# Patient Record
Sex: Male | Born: 1970 | Race: White | Hispanic: No | Smoking: Current every day smoker
Health system: Southern US, Community
[De-identification: ages and names within clinical notes are randomized; demographics above are authoritative.]

## PROBLEM LIST (undated history)

## (undated) DIAGNOSIS — I10 Essential (primary) hypertension: Secondary | ICD-10-CM

## (undated) DIAGNOSIS — N200 Calculus of kidney: Secondary | ICD-10-CM

## (undated) DIAGNOSIS — M1990 Unspecified osteoarthritis, unspecified site: Secondary | ICD-10-CM

## (undated) DIAGNOSIS — F329 Major depressive disorder, single episode, unspecified: Secondary | ICD-10-CM

## (undated) DIAGNOSIS — F319 Bipolar disorder, unspecified: Secondary | ICD-10-CM

## (undated) DIAGNOSIS — F32A Depression, unspecified: Secondary | ICD-10-CM

---
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
## 2002-09-24 HISTORY — PX: APPENDECTOMY: SHX54

## 2004-12-28 ENCOUNTER — Emergency Department (HOSPITAL_COMMUNITY): Admission: EM | Admit: 2004-12-28 | Discharge: 2004-12-28 | Payer: Self-pay | Admitting: Emergency Medicine

## 2005-04-25 ENCOUNTER — Emergency Department (HOSPITAL_COMMUNITY): Admission: EM | Admit: 2005-04-25 | Discharge: 2005-04-25 | Payer: Self-pay | Admitting: Emergency Medicine

## 2006-04-23 ENCOUNTER — Emergency Department (HOSPITAL_COMMUNITY): Admission: EM | Admit: 2006-04-23 | Discharge: 2006-04-24 | Payer: Self-pay | Admitting: Emergency Medicine

## 2007-01-04 ENCOUNTER — Inpatient Hospital Stay (HOSPITAL_COMMUNITY): Admission: EM | Admit: 2007-01-04 | Discharge: 2007-01-07 | Payer: Self-pay | Admitting: Emergency Medicine

## 2007-01-10 ENCOUNTER — Emergency Department (HOSPITAL_COMMUNITY): Admission: EM | Admit: 2007-01-10 | Discharge: 2007-01-10 | Payer: Self-pay | Admitting: Emergency Medicine

## 2007-07-25 ENCOUNTER — Emergency Department (HOSPITAL_COMMUNITY): Admission: EM | Admit: 2007-07-25 | Discharge: 2007-07-25 | Payer: Self-pay | Admitting: Emergency Medicine

## 2007-11-08 ENCOUNTER — Other Ambulatory Visit: Payer: Self-pay | Admitting: Emergency Medicine

## 2007-11-08 ENCOUNTER — Ambulatory Visit: Payer: Self-pay | Admitting: *Deleted

## 2007-11-08 ENCOUNTER — Inpatient Hospital Stay (HOSPITAL_COMMUNITY): Admission: AD | Admit: 2007-11-08 | Discharge: 2007-11-17 | Payer: Self-pay | Admitting: *Deleted

## 2009-02-24 ENCOUNTER — Emergency Department (HOSPITAL_COMMUNITY): Admission: EM | Admit: 2009-02-24 | Discharge: 2009-02-25 | Payer: Self-pay | Admitting: Emergency Medicine

## 2009-02-25 ENCOUNTER — Inpatient Hospital Stay (HOSPITAL_COMMUNITY): Admission: AD | Admit: 2009-02-25 | Discharge: 2009-03-02 | Payer: Self-pay | Admitting: *Deleted

## 2009-02-25 ENCOUNTER — Emergency Department (HOSPITAL_COMMUNITY): Admission: EM | Admit: 2009-02-25 | Discharge: 2009-02-25 | Payer: Self-pay | Admitting: *Deleted

## 2009-02-25 ENCOUNTER — Ambulatory Visit: Payer: Self-pay | Admitting: *Deleted

## 2009-09-24 ENCOUNTER — Emergency Department (HOSPITAL_COMMUNITY): Admission: EM | Admit: 2009-09-24 | Discharge: 2009-09-24 | Payer: Self-pay | Admitting: Emergency Medicine

## 2009-09-25 ENCOUNTER — Ambulatory Visit: Payer: Self-pay | Admitting: Psychiatry

## 2009-09-25 ENCOUNTER — Inpatient Hospital Stay (HOSPITAL_COMMUNITY): Admission: RE | Admit: 2009-09-25 | Discharge: 2009-09-29 | Payer: Self-pay | Admitting: Psychiatry

## 2009-10-29 ENCOUNTER — Emergency Department (HOSPITAL_COMMUNITY): Admission: EM | Admit: 2009-10-29 | Discharge: 2009-10-30 | Payer: Self-pay | Admitting: Emergency Medicine

## 2009-10-30 ENCOUNTER — Ambulatory Visit: Payer: Self-pay | Admitting: Psychiatry

## 2009-10-30 ENCOUNTER — Inpatient Hospital Stay (HOSPITAL_COMMUNITY): Admission: RE | Admit: 2009-10-30 | Discharge: 2009-11-03 | Payer: Self-pay | Admitting: Psychiatry

## 2009-11-03 ENCOUNTER — Emergency Department (HOSPITAL_BASED_OUTPATIENT_CLINIC_OR_DEPARTMENT_OTHER): Admission: EM | Admit: 2009-11-03 | Discharge: 2009-11-04 | Payer: Self-pay | Admitting: Emergency Medicine

## 2009-11-04 ENCOUNTER — Inpatient Hospital Stay (HOSPITAL_COMMUNITY): Admission: AD | Admit: 2009-11-04 | Discharge: 2009-11-11 | Payer: Self-pay | Admitting: Psychiatry

## 2010-08-16 ENCOUNTER — Ambulatory Visit: Payer: Self-pay | Admitting: Psychiatry

## 2010-08-16 ENCOUNTER — Emergency Department (HOSPITAL_COMMUNITY): Admission: EM | Admit: 2010-08-16 | Discharge: 2010-08-16 | Payer: Self-pay | Admitting: Emergency Medicine

## 2010-08-17 ENCOUNTER — Inpatient Hospital Stay (HOSPITAL_COMMUNITY)
Admission: EM | Admit: 2010-08-17 | Discharge: 2010-08-22 | Payer: Self-pay | Source: Home / Self Care | Admitting: Psychiatry

## 2010-08-18 ENCOUNTER — Emergency Department (HOSPITAL_COMMUNITY): Admission: EM | Admit: 2010-08-18 | Discharge: 2010-08-18 | Payer: Self-pay | Admitting: Emergency Medicine

## 2010-08-22 ENCOUNTER — Emergency Department (HOSPITAL_COMMUNITY)
Admission: EM | Admit: 2010-08-22 | Discharge: 2010-08-27 | Disposition: A | Discharge status: Other Acute Inpatient Hospital | Payer: Self-pay | Source: Home / Self Care | Admitting: Emergency Medicine

## 2010-08-23 DIAGNOSIS — IMO0002 Reserved for concepts with insufficient information to code with codable children: Secondary | ICD-10-CM

## 2010-12-05 LAB — DIFFERENTIAL
Basophils Absolute: 0 10*3/uL (ref 0.0–0.1)
Basophils Absolute: 0 10*3/uL (ref 0.0–0.1)
Basophils Relative: 1 % (ref 0–1)
Eosinophils Absolute: 0.1 10*3/uL (ref 0.0–0.7)
Eosinophils Relative: 3 % (ref 0–5)
Lymphocytes Relative: 33 % (ref 12–46)
Lymphocytes Relative: 25 % (ref 12–46)
Lymphocytes Relative: 27 % (ref 12–46)
Lymphs Abs: 1.9 10*3/uL (ref 0.7–4.0)
Monocytes Absolute: 0.5 10*3/uL (ref 0.1–1.0)
Monocytes Absolute: 0.4 10*3/uL (ref 0.1–1.0)
Monocytes Relative: 8 % (ref 3–12)
Neutro Abs: 3.3 10*3/uL (ref 1.7–7.7)
Neutro Abs: 4.9 10*3/uL (ref 1.7–7.7)
Neutrophils Relative %: 65 % (ref 43–77)

## 2010-12-05 LAB — CBC
HCT: 43.4 % (ref 39.0–52.0)
Hemoglobin: 15.2 g/dL (ref 13.0–17.0)
Hemoglobin: 14.9 g/dL (ref 13.0–17.0)
MCHC: 35.3 g/dL (ref 30.0–36.0)
MCV: 89.5 fL (ref 78.0–100.0)
Platelets: 275 10*3/uL (ref 150–400)
RBC: 4.73 MIL/uL (ref 4.22–5.81)
RBC: 4.59 MIL/uL (ref 4.22–5.81)
RDW: 13.1 % (ref 11.5–15.5)
RDW: 13 % (ref 11.5–15.5)
WBC: 6.1 10*3/uL (ref 4.0–10.5)
WBC: 5.6 10*3/uL (ref 4.0–10.5)
WBC: 7.6 10*3/uL (ref 4.0–10.5)

## 2010-12-05 LAB — RAPID URINE DRUG SCREEN, HOSP PERFORMED
Amphetamines: NOT DETECTED
Barbiturates: NOT DETECTED
Benzodiazepines: POSITIVE — AB
Benzodiazepines: POSITIVE — AB
Cocaine: NOT DETECTED
Cocaine: NOT DETECTED
Opiates: POSITIVE — AB
Tetrahydrocannabinol: NOT DETECTED

## 2010-12-05 LAB — URINE CULTURE
Colony Count: 80000
Culture  Setup Time: 201111300120

## 2010-12-05 LAB — URINALYSIS, ROUTINE W REFLEX MICROSCOPIC
Bilirubin Urine: NEGATIVE
Bilirubin Urine: NEGATIVE
Glucose, UA: NEGATIVE mg/dL
Glucose, UA: NEGATIVE mg/dL
Ketones, ur: NEGATIVE mg/dL
Nitrite: POSITIVE — AB
Specific Gravity, Urine: 1.027 (ref 1.005–1.030)
Urobilinogen, UA: 0.2 mg/dL (ref 0.0–1.0)
pH: 6 (ref 5.0–8.0)
pH: 7.5 (ref 5.0–8.0)

## 2010-12-05 LAB — BASIC METABOLIC PANEL
BUN: 9 mg/dL (ref 6–23)
BUN: 11 mg/dL (ref 6–23)
Chloride: 100 mEq/L (ref 96–112)
Creatinine, Ser: 0.86 mg/dL (ref 0.4–1.5)
GFR calc Af Amer: >60 mL/min (ref >60)
GFR calc non Af Amer: >60 mL/min (ref >60)
Glucose, Bld: 104 mg/dL — ABNORMAL HIGH (ref 70–99)
Potassium: 3.8 mEq/L (ref 3.5–5.1)
Sodium: 136 mEq/L (ref 135–145)

## 2010-12-05 LAB — POCT I-STAT, CHEM 8
Chloride: 101 mEq/L (ref 96–112)
Creatinine, Ser: 0.8 mg/dL (ref 0.4–1.5)
Glucose, Bld: 98 mg/dL (ref 70–99)
HCT: 45 % (ref 39.0–52.0)
Potassium: 4.1 mEq/L (ref 3.5–5.1)

## 2010-12-05 LAB — POCT CARDIAC MARKERS
CKMB, poc: <1 ng/mL — ABNORMAL LOW (ref 1.0–8.0)
Myoglobin, poc: 37.7 ng/mL (ref 12–200)
Troponin i, poc: <0.05 ng/mL (ref 0.00–0.09)

## 2010-12-05 LAB — URINE MICROSCOPIC-ADD ON

## 2010-12-05 LAB — TRICYCLICS SCREEN, URINE: TCA Scrn: NOT DETECTED

## 2010-12-05 LAB — ETHANOL: Alcohol, Ethyl (B): <5 mg/dL (ref 0–10)

## 2010-12-10 LAB — COMPREHENSIVE METABOLIC PANEL
ALT: 43 U/L (ref 0–53)
AST: 69 U/L — ABNORMAL HIGH (ref 0–37)
Alkaline Phosphatase: 63 U/L (ref 39–117)
CO2: 27 mEq/L (ref 19–32)
CO2: 30 mEq/L (ref 19–32)
Calcium: 8.8 mg/dL (ref 8.4–10.5)
Chloride: 102 mEq/L (ref 96–112)
Creatinine, Ser: 0.91 mg/dL (ref 0.4–1.5)
GFR calc Af Amer: >60 mL/min (ref >60)
GFR calc non Af Amer: >60 mL/min (ref >60)
GFR calc non Af Amer: >60 mL/min (ref >60)
Glucose, Bld: 101 mg/dL — ABNORMAL HIGH (ref 70–99)
Glucose, Bld: 113 mg/dL — ABNORMAL HIGH (ref 70–99)
Potassium: 4.3 mEq/L (ref 3.5–5.1)
Sodium: 133 mEq/L — ABNORMAL LOW (ref 135–145)
Sodium: 135 mEq/L (ref 135–145)
Total Bilirubin: 0.5 mg/dL (ref 0.3–1.2)
Total Protein: 6.3 g/dL (ref 6.0–8.3)
Total Protein: 6.6 g/dL (ref 6.0–8.3)

## 2010-12-10 LAB — URINALYSIS, ROUTINE W REFLEX MICROSCOPIC
Glucose, UA: NEGATIVE mg/dL
Ketones, ur: NEGATIVE mg/dL
Nitrite: NEGATIVE
Specific Gravity, Urine: 1.02 (ref 1.005–1.030)
pH: 5.5 (ref 5.0–8.0)

## 2010-12-10 LAB — URINE MICROSCOPIC-ADD ON

## 2010-12-10 LAB — LIPASE, BLOOD: Lipase: 21 U/L (ref 11–59)

## 2010-12-10 LAB — LACTIC ACID, PLASMA: Lactic Acid, Venous: 2.4 mmol/L — ABNORMAL HIGH (ref 0.5–2.2)

## 2010-12-10 LAB — GC/CHLAMYDIA PROBE AMP, GENITAL
Chlamydia, DNA Probe: NEGATIVE
GC Probe Amp, Genital: NEGATIVE

## 2010-12-10 LAB — DIFFERENTIAL
Lymphocytes Relative: 23 % (ref 12–46)
Lymphs Abs: 1.8 10*3/uL (ref 0.7–4.0)
Monocytes Relative: 8 % (ref 3–12)
Neutrophils Relative %: 65 % (ref 43–77)

## 2010-12-10 LAB — LIPID PANEL
Cholesterol: 173 mg/dL (ref 0–200)
Total CHOL/HDL Ratio: 5.6 RATIO

## 2010-12-10 LAB — CBC
MCHC: 34.7 g/dL (ref 30.0–36.0)
MCV: 89 fL (ref 78.0–100.0)
RBC: 4.07 MIL/uL — ABNORMAL LOW (ref 4.22–5.81)
RDW: 14.1 % (ref 11.5–15.5)

## 2010-12-10 LAB — POTASSIUM: Potassium: 3.8 mEq/L (ref 3.5–5.1)

## 2010-12-10 LAB — URINE CULTURE

## 2010-12-10 LAB — RAPID URINE DRUG SCREEN, HOSP PERFORMED: Tetrahydrocannabinol: NOT DETECTED

## 2010-12-13 LAB — RAPID URINE DRUG SCREEN, HOSP PERFORMED
Barbiturates: NOT DETECTED
Benzodiazepines: POSITIVE — AB
Opiates: NOT DETECTED

## 2010-12-13 LAB — CBC
HCT: 37.7 % — ABNORMAL LOW (ref 39.0–52.0)
Hemoglobin: 12.9 g/dL — ABNORMAL LOW (ref 13.0–17.0)
MCHC: 34.2 g/dL (ref 30.0–36.0)
MCV: 88.4 fL (ref 78.0–100.0)
Platelets: 266 10*3/uL (ref 150–400)
RBC: 4.27 MIL/uL (ref 4.22–5.81)
RDW: 14.4 % (ref 11.5–15.5)
WBC: 9.7 10*3/uL (ref 4.0–10.5)

## 2010-12-13 LAB — URINALYSIS, ROUTINE W REFLEX MICROSCOPIC
Glucose, UA: NEGATIVE mg/dL
Protein, ur: NEGATIVE mg/dL
Specific Gravity, Urine: 1.02 (ref 1.005–1.030)

## 2010-12-13 LAB — BASIC METABOLIC PANEL
CO2: 25 mEq/L (ref 19–32)
Calcium: 9.2 mg/dL (ref 8.4–10.5)
Creatinine, Ser: 0.87 mg/dL (ref 0.4–1.5)
GFR calc non Af Amer: >60 mL/min (ref >60)
Glucose, Bld: 83 mg/dL (ref 70–99)
Sodium: 136 mEq/L (ref 135–145)

## 2010-12-13 LAB — DIFFERENTIAL
Basophils Absolute: 0 10*3/uL (ref 0.0–0.1)
Basophils Relative: 0 % (ref 0–1)
Lymphocytes Relative: 12 % (ref 12–46)
Neutro Abs: 7.7 10*3/uL (ref 1.7–7.7)
Neutrophils Relative %: 79 % — ABNORMAL HIGH (ref 43–77)

## 2010-12-13 LAB — SALICYLATE LEVEL: Salicylate Lvl: <4 mg/dL (ref 2.8–20.0)

## 2010-12-13 LAB — ACETAMINOPHEN LEVEL: Acetaminophen (Tylenol), Serum: <10 ug/mL — ABNORMAL LOW (ref 10–30)

## 2010-12-14 LAB — DIFFERENTIAL
Eosinophils Absolute: 0.2 10*3/uL (ref 0.0–0.7)
Lymphs Abs: 1.6 10*3/uL (ref 0.7–4.0)
Monocytes Absolute: 0.5 10*3/uL (ref 0.1–1.0)
Monocytes Relative: 8 % (ref 3–12)
Neutro Abs: 3.7 10*3/uL (ref 1.7–7.7)
Neutrophils Relative %: 62 % (ref 43–77)

## 2010-12-14 LAB — BASIC METABOLIC PANEL
Chloride: 101 mEq/L (ref 96–112)
Creatinine, Ser: 0.8 mg/dL (ref 0.4–1.5)
GFR calc Af Amer: >60 mL/min (ref >60)
Sodium: 139 mEq/L (ref 135–145)

## 2010-12-14 LAB — POCT TOXICOLOGY PANEL

## 2010-12-14 LAB — CBC
Hemoglobin: 13 g/dL (ref 13.0–17.0)
MCV: 87.3 fL (ref 78.0–100.0)
RBC: 4.29 MIL/uL (ref 4.22–5.81)
WBC: 6 10*3/uL (ref 4.0–10.5)

## 2010-12-14 LAB — ETHANOL: Alcohol, Ethyl (B): <10 mg/dL (ref 0–10)

## 2011-01-01 LAB — DIFFERENTIAL
Eosinophils Absolute: 0.5 10*3/uL (ref 0.0–0.7)
Eosinophils Relative: 7 % — ABNORMAL HIGH (ref 0–5)
Lymphs Abs: 1.2 10*3/uL (ref 0.7–4.0)
Monocytes Relative: 8 % (ref 3–12)

## 2011-01-01 LAB — CBC
HCT: 47.7 % (ref 39.0–52.0)
MCHC: 34.2 g/dL (ref 30.0–36.0)
MCV: 92.7 fL (ref 78.0–100.0)
RBC: 5.14 MIL/uL (ref 4.22–5.81)
WBC: 7.3 10*3/uL (ref 4.0–10.5)

## 2011-01-01 LAB — RAPID URINE DRUG SCREEN, HOSP PERFORMED
Amphetamines: NOT DETECTED
Benzodiazepines: POSITIVE — AB
Cocaine: NOT DETECTED
Tetrahydrocannabinol: NOT DETECTED

## 2011-01-01 LAB — POCT I-STAT, CHEM 8
Calcium, Ion: 1.11 mmol/L — ABNORMAL LOW (ref 1.12–1.32)
Creatinine, Ser: 1.2 mg/dL (ref 0.4–1.5)
Glucose, Bld: 91 mg/dL (ref 70–99)
Hemoglobin: 17 g/dL (ref 13.0–17.0)
TCO2: 29 mmol/L (ref 0–100)

## 2011-01-01 LAB — VALPROIC ACID LEVEL: Valproic Acid Lvl: 55.6 ug/mL (ref 50.0–100.0)
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
## 2011-02-09 NOTE — Cardiovascular Report (Signed)
[name], [name]               ACCOUNT NO.:  [id number]   MEDICAL RECORD NO.:  [id number]          PATIENT TYPE:  INP   LOCATION:  4703                         FACILITY:  [facility]   PHYSICIAN:  Elmore Guise., M.D.DATE OF BIRTH:  09-16-1971   DATE OF PROCEDURE:  01/06/2007  DATE OF DISCHARGE:                            CARDIAC CATHETERIZATION   INDICATIONS FOR PROCEDURE:  Chest pain and strong family history of  early heart disease with multiple cardiac risk factors.   DESCRIPTION OF PROCEDURE:  The patient was brought to the cardiac  catheterization lab after appropriate informed consent.  He was prepped  and draped in sterile fashion.  Approximately 10 mL of 1% lidocaine was  used for local anesthesia.  A 5-French sheath was placed in the right  femoral artery without difficulty.  Coronary angiography and LV  angiography were then performed.  The patient tolerated procedure well  and was transferred from the cardiac catheterization lab in stable  condition.   FINDINGS:  1. Left Main:  Normal.  2. LAD:  Large vessel with mild luminal irregularities.  3. Diagonal-1 and diagonal-2:  Small vessels with mild luminal      irregularities.  4. Circumflex:  Nondominant with mild luminal irregularities.  5. OM-1:  Moderate size with mild luminal irregularities.  6. OM-2:  Moderate size with mild luminal irregularities.  7. Ramus intermedius:  Moderate size with mild luminal irregularities.  8. RCA:  Dominant, large vessel with mild luminal irregularities.  9. PDA and posterolateral vessel:  Normal-appearing with mild luminal      irregularities.  10.LV:  EF of 60%.  No wall motion abnormalities.  LVEDP is 18 mmHg.   IMPRESSION:  1. Normal-appearing coronary arteries with mild luminal      irregularities.  2. Normal left ventricular systolic function with ejection fraction of      60%.   PLAN:  At this time, I recommend aggressive risk factor modification  with blood pressure  and lipid management.  He will continue his atenolol  and Norvasc. Because of his elevated triglycerides, I discussed dietary  changes as well as exercise and adding fish oil tablets to his regimen.  He will follow up in 2 weeks.      Elmore Guise., M.D.  Electronically Signed     TWK/MEDQ  D:  01/06/2007  T:  01/06/2007  Job:  [id number]
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
## 2011-02-09 NOTE — Discharge Summary (Signed)
[name], [name]               ACCOUNT NO.:  [id number]   MEDICAL RECORD NO.:  [id number]          PATIENT TYPE:  INP   LOCATION:  4703                         FACILITY:  [facility]   PHYSICIAN:  Wilson Singer, M.D.DATE OF BIRTH:  May 01, 1971   DATE OF ADMISSION:  01/04/2007  DATE OF DISCHARGE:                               DISCHARGE SUMMARY   FINAL DISCHARGE DIAGNOSES:  1. Chest pain, possibly atypical.  2. Hypertension.  3. Tobacco abuse.   MEDICATIONS ON DISCHARGE:  1. Fish oil tablets 1 gram 4 a day.  2. Atenolol 25 mg daily.  3. Norvasc 10 mg daily.   CONDITION ON DISCHARGE:  Stable.   HISTORY:  This 40 year old male, who has a strong family history of  early coronary artery disease is a smoker and is hypertensive, came in  with cardiac-sounding chest pain.  Please see initial history and  physical examination by Dr. Hillery Aldo.   HOSPITAL PROGRESS:  He was admitted to the hospital and nitro paste was  applied.  This seemed to help his chest pain.  Serial cardiac enzymes  were negative.  There were no real electrocardiographic changes, but  cardiology was consulted and in view of his very strong family history  and typical symptoms, Dr. Reyes Ivan, the cardiologist, did a cardiac  catheterization.  Cardiac catheterization showed normal-appearing  coronary arteries and ejection fraction of 60% with normal left  ventricular systolic function.  He recommended that in addition to the  atenolol and Norvasc that he was taking, that he would add fish oil  tablets for hypertriglyceridemia that was seen.  On the day of  discharge, he looked well.  Temperature 97.8, blood pressure 104/62,  pulse 76, saturation 93%.  Heart sounds were present and normal.  Lung  fields were clear.  Investigations:  Sodium 140, potassium 3.9, chloride  104, bicarbonate 27, glucose 94, BUN 8, creatinine 0.95.  His lipid  profile showed an HDL of 31 and an LDL cholesterol of 64 with  triglycerides  that were over 300.  His total cholesterol was 169.   FURTHER DISPOSITION:  We have told him to quit smoking and he has been  given counseling on this.  He will try and lose weight and exercise  more.  He must follow up with a primary care physician in the next  couple of weeks or so.  He does have an appointment to follow up with  Dr. Reyes Ivan, cardiologist, in the next 2 weeks.     Wilson Singer, M.D.  Electronically Signed    NCG/MEDQ  D:  01/07/2007  T:  01/07/2007  Job:  [id number]   cc:   Elmore Guise., M.D.

## 2011-06-15 LAB — BASIC METABOLIC PANEL
BUN: 10
GFR calc non Af Amer: >60
Glucose, Bld: 91
Potassium: 4.7

## 2011-06-15 LAB — DIFFERENTIAL
Basophils Absolute: 0
Eosinophils Absolute: 0.3
Eosinophils Relative: 3
Lymphocytes Relative: 15

## 2011-06-15 LAB — COMPREHENSIVE METABOLIC PANEL
ALT: 86 — ABNORMAL HIGH
AST: 52 — ABNORMAL HIGH
Alkaline Phosphatase: 63
CO2: 30
Chloride: 98
GFR calc Af Amer: >60
GFR calc non Af Amer: >60
Potassium: 4.6
Sodium: 138
Total Bilirubin: 0.7

## 2011-06-15 LAB — RAPID URINE DRUG SCREEN, HOSP PERFORMED
Barbiturates: NOT DETECTED
Benzodiazepines: POSITIVE — AB

## 2011-06-15 LAB — CBC
HCT: 34.3 — ABNORMAL LOW
Platelets: 191
RDW: 15.7 — ABNORMAL HIGH

## 2011-07-04 LAB — CBC
HCT: 37.1 — ABNORMAL LOW
Hemoglobin: 12.8 — ABNORMAL LOW
MCHC: 34.5
MCV: 89.6
Platelets: 324
RBC: 4.13 — ABNORMAL LOW
RDW: 14.6 — ABNORMAL HIGH
WBC: 7.9

## 2011-07-04 LAB — RAPID URINE DRUG SCREEN, HOSP PERFORMED
Amphetamines: NOT DETECTED
Barbiturates: POSITIVE — AB
Benzodiazepines: POSITIVE — AB
Cocaine: NOT DETECTED
Opiates: POSITIVE — AB
Tetrahydrocannabinol: NOT DETECTED

## 2011-07-04 LAB — DIFFERENTIAL
Basophils Absolute: 0
Basophils Relative: 1
Eosinophils Absolute: 0.3
Eosinophils Relative: 3
Lymphocytes Relative: 23
Lymphs Abs: 1.8
Monocytes Absolute: 0.6
Monocytes Relative: 8
Neutro Abs: 5.2
Neutrophils Relative %: 66

## 2011-07-04 LAB — ETHANOL: Alcohol, Ethyl (B): <5

## 2011-07-04 LAB — COMPREHENSIVE METABOLIC PANEL
ALT: 68 — ABNORMAL HIGH
Alkaline Phosphatase: 68
CO2: 26
Calcium: 8.4
GFR calc non Af Amer: >60
Glucose, Bld: 100 — ABNORMAL HIGH
Potassium: 4
Sodium: 128 — ABNORMAL LOW

## 2011-10-01 ENCOUNTER — Emergency Department (HOSPITAL_BASED_OUTPATIENT_CLINIC_OR_DEPARTMENT_OTHER)
Admission: EM | Admit: 2011-10-01 | Discharge: 2011-10-01 | Disposition: A | Discharge status: Home / Self Care | Payer: Self-pay | Attending: Emergency Medicine | Admitting: Emergency Medicine

## 2011-10-01 ENCOUNTER — Encounter: Payer: Self-pay | Admitting: Family Medicine

## 2011-10-01 ENCOUNTER — Emergency Department (INDEPENDENT_AMBULATORY_CARE_PROVIDER_SITE_OTHER): Payer: Self-pay

## 2011-10-01 DIAGNOSIS — Z8739 Personal history of other diseases of the musculoskeletal system and connective tissue: Secondary | ICD-10-CM | POA: Insufficient documentation

## 2011-10-01 DIAGNOSIS — I10 Essential (primary) hypertension: Secondary | ICD-10-CM | POA: Insufficient documentation

## 2011-10-01 DIAGNOSIS — F329 Major depressive disorder, single episode, unspecified: Secondary | ICD-10-CM | POA: Insufficient documentation

## 2011-10-01 DIAGNOSIS — R109 Unspecified abdominal pain: Secondary | ICD-10-CM

## 2011-10-01 DIAGNOSIS — E7889 Other lipoprotein metabolism disorders: Secondary | ICD-10-CM

## 2011-10-01 DIAGNOSIS — F3289 Other specified depressive episodes: Secondary | ICD-10-CM | POA: Insufficient documentation

## 2011-10-01 DIAGNOSIS — R1110 Vomiting, unspecified: Secondary | ICD-10-CM

## 2011-10-01 DIAGNOSIS — F17200 Nicotine dependence, unspecified, uncomplicated: Secondary | ICD-10-CM | POA: Insufficient documentation

## 2011-10-01 HISTORY — DX: Essential (primary) hypertension: I10

## 2011-10-01 HISTORY — DX: Calculus of kidney: N20.0

## 2011-10-01 HISTORY — DX: Unspecified osteoarthritis, unspecified site: M19.90

## 2011-10-01 HISTORY — DX: Depression, unspecified: F32.A

## 2011-10-01 HISTORY — DX: Major depressive disorder, single episode, unspecified: F32.9

## 2011-10-01 LAB — DIFFERENTIAL
Basophils Absolute: 0 10*3/uL (ref 0.0–0.1)
Lymphocytes Relative: 29 % (ref 12–46)
Neutro Abs: 3.1 10*3/uL (ref 1.7–7.7)

## 2011-10-01 LAB — CBC
Platelets: 264 10*3/uL (ref 150–400)
RDW: 12.9 % (ref 11.5–15.5)
WBC: 6.4 10*3/uL (ref 4.0–10.5)

## 2011-10-01 LAB — COMPREHENSIVE METABOLIC PANEL
AST: 65 U/L — ABNORMAL HIGH (ref 0–37)
Albumin: 4.1 g/dL (ref 3.5–5.2)
Alkaline Phosphatase: 86 U/L (ref 39–117)
Chloride: 99 mEq/L (ref 96–112)
Potassium: 3.9 mEq/L (ref 3.5–5.1)
Total Bilirubin: 0.4 mg/dL (ref 0.3–1.2)

## 2011-10-01 LAB — URINALYSIS, ROUTINE W REFLEX MICROSCOPIC
Glucose, UA: NEGATIVE mg/dL
Hgb urine dipstick: NEGATIVE
Ketones, ur: NEGATIVE mg/dL
Protein, ur: NEGATIVE mg/dL

## 2011-10-01 LAB — LIPASE, BLOOD: Lipase: 99 U/L — ABNORMAL HIGH (ref 11–59)

## 2011-10-01 MED ORDER — DIPHENHYDRAMINE HCL 50 MG/ML IJ SOLN
12.5000 mg | Freq: Once | INTRAMUSCULAR | Status: AC
  Administered 2011-10-01: 12.5 mg via INTRAVENOUS
  Filled 2011-10-01: qty 1

## 2011-10-01 MED ORDER — PROMETHAZINE HCL 25 MG PO TABS: 25.0000 mg | ORAL_TABLET | Freq: Four times a day (QID) | ORAL | Status: AC | PRN

## 2011-10-01 MED ORDER — HYDROMORPHONE HCL PF 1 MG/ML IJ SOLN
1.0000 mg | Freq: Once | INTRAMUSCULAR | Status: AC
  Administered 2011-10-01: 1 mg via INTRAVENOUS
  Filled 2011-10-01: qty 1

## 2011-10-01 MED ORDER — ONDANSETRON HCL 4 MG/2ML IJ SOLN
4.0000 mg | Freq: Once | INTRAMUSCULAR | Status: AC
  Administered 2011-10-01: 4 mg via INTRAVENOUS
  Filled 2011-10-01: qty 2

## 2011-10-01 MED ORDER — OXYCODONE-ACETAMINOPHEN 5-325 MG PO TABS: 2.0000 | ORAL_TABLET | ORAL | Status: AC | PRN

## 2011-10-01 NOTE — ED Provider Notes (Signed)
History     CSN: [id number]  Arrival date & time 10/01/11  1309   None     Chief Complaint  Patient presents with  . Abdominal Pain  . Emesis    (Consider location/radiation/quality/duration/timing/severity/associated sxs/prior treatment) Patient is a 41 y.o. male presenting with cramps. The history is provided by the patient. No language interpreter was used.  Abdominal Cramping The primary symptoms of the illness include abdominal pain, vomiting and diarrhea. The current episode started more than 2 days ago. The onset of the illness was gradual. The problem has been gradually worsening.  The illness is associated with a recent illness. Significant associated medical issues do not include PUD, GERD, gallstones or diverticulitis.   Pt complains of pain in right upper abdomen.  Pt reports he has had vomitting on and off. Past Medical History  Diagnosis Date  . Hypertension   . Depression   . Arthritis   . Kidney stone     Past Surgical History  Procedure Date  . Appendectomy   . Hip surgery     No family history on file.  History  Substance Use Topics  . Smoking status: Current Everyday Smoker  . Smokeless tobacco: Not on file  . Alcohol Use: No      Review of Systems  Gastrointestinal: Positive for vomiting, abdominal pain and diarrhea.  All other systems reviewed and are negative.    Allergies  Morphine and related; Penicillins; and Toradol  Home Medications   Current Outpatient Rx  Name Route Sig Dispense Refill  . ALPRAZOLAM 0.5 MG PO TABS Oral Take 0.5 mg by mouth at bedtime as needed.      Marland Kitchen FLUOXETINE HCL 20 MG PO TABS Oral Take 20 mg by mouth daily.      Marland Kitchen LISINOPRIL 10 MG PO TABS Oral Take 10 mg by mouth daily.        BP 132/71  Pulse 127  Temp(Src) 98.9 F (37.2 C) (Oral)  Resp 18  Ht 6\' 1"  (1.854 m)  Wt 260 lb (117.935 kg)  BMI 34.30 kg/m2  SpO2 98%  Physical Exam  Nursing note and vitals reviewed. Constitutional: He is oriented to  person, place, and time. He appears well-developed and well-nourished.  HENT:  Head: Normocephalic and atraumatic.  Right Ear: External ear normal.  Left Ear: External ear normal.  Nose: Nose normal.  Mouth/Throat: Oropharynx is clear and moist.  Eyes: Conjunctivae are normal. Pupils are equal, round, and reactive to light.  Neck: Normal range of motion. Neck supple.  Cardiovascular: Normal rate, regular rhythm and normal heart sounds.   Pulmonary/Chest: Effort normal and breath sounds normal.  Abdominal: Soft. There is tenderness.       Tender right upper quadrant  Musculoskeletal: Normal range of motion.  Neurological: He is alert and oriented to person, place, and time.  Psychiatric: He has a normal mood and affect.    ED Course  Procedures (including critical care time)  Labs Reviewed - No data to display No results found.   No diagnosis found.    MDM  Dr. Judd Lien in to see and examine pt.   I counseled pt on possible pancreatitis.  I advised clear liquids x 24 hours.  Pain medications and nausea medications       Langston Masker, [place] 10/01/11 1747

## 2011-10-01 NOTE — ED Provider Notes (Signed)
Medical screening examination/treatment/procedure(s) were performed by non-physician practitioner and as supervising physician I was immediately available for consultation/collaboration.   Dayton Bailiff, MD 10/01/11 2013

## 2011-10-01 NOTE — ED Notes (Signed)
Pt c/o diffuse abdominal pain and vomiting x 2 wks more frequent and worse 2-3 days. Pt denies diarrhea.

## 2013-07-24 ENCOUNTER — Encounter: Payer: Self-pay | Admitting: Family Medicine

## 2013-07-24 ENCOUNTER — Ambulatory Visit (INDEPENDENT_AMBULATORY_CARE_PROVIDER_SITE_OTHER): Payer: Medicare Other | Admitting: Family Medicine

## 2013-07-24 VITALS — BP 130/87 | HR 102 | Ht 73.0 in | Wt 265.0 lb

## 2013-07-24 DIAGNOSIS — M25551 Pain in right hip: Secondary | ICD-10-CM | POA: Insufficient documentation

## 2013-07-24 DIAGNOSIS — E785 Hyperlipidemia, unspecified: Secondary | ICD-10-CM

## 2013-07-24 DIAGNOSIS — I10 Essential (primary) hypertension: Secondary | ICD-10-CM

## 2013-07-24 DIAGNOSIS — M25559 Pain in unspecified hip: Secondary | ICD-10-CM

## 2013-07-24 DIAGNOSIS — F319 Bipolar disorder, unspecified: Secondary | ICD-10-CM

## 2013-07-24 DIAGNOSIS — K859 Acute pancreatitis without necrosis or infection, unspecified: Secondary | ICD-10-CM

## 2013-07-24 DIAGNOSIS — R1011 Right upper quadrant pain: Secondary | ICD-10-CM

## 2013-07-24 MED ORDER — OXYCODONE-ACETAMINOPHEN 5-325 MG PO TABS: 1.0000 | ORAL_TABLET | Freq: Three times a day (TID) | ORAL | Status: DC | PRN

## 2013-07-24 MED ORDER — ARIPIPRAZOLE 10 MG PO TABS: 10.0000 mg | ORAL_TABLET | Freq: Every day | ORAL | Status: AC

## 2013-07-24 MED ORDER — OMEPRAZOLE 40 MG PO CPDR: DELAYED_RELEASE_CAPSULE | ORAL | Status: AC
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
## 2013-08-12 ENCOUNTER — Telehealth: Payer: Self-pay | Admitting: *Deleted

## 2013-08-12 NOTE — Telephone Encounter (Signed)
Pt called wanting to know if he could get a rx called in for anxiety. Advised pt that he would need an appt. Pt states he doesn't have the copay right now to come in. Pt did voice understanding that an appt would be needed but declined scheduling an appt at this time

## 2013-08-24 ENCOUNTER — Telehealth: Payer: Self-pay | Admitting: *Deleted

## 2013-08-24 ENCOUNTER — Encounter: Payer: Self-pay | Admitting: Family Medicine

## 2013-08-24 ENCOUNTER — Ambulatory Visit (INDEPENDENT_AMBULATORY_CARE_PROVIDER_SITE_OTHER): Payer: Medicare HMO | Admitting: Family Medicine

## 2013-08-24 VITALS — BP 142/102 | HR 98 | Wt 268.0 lb

## 2013-08-24 DIAGNOSIS — M25559 Pain in unspecified hip: Secondary | ICD-10-CM

## 2013-08-24 DIAGNOSIS — Z3009 Encounter for other general counseling and advice on contraception: Secondary | ICD-10-CM

## 2013-08-24 DIAGNOSIS — M25551 Pain in right hip: Secondary | ICD-10-CM

## 2013-08-24 DIAGNOSIS — F411 Generalized anxiety disorder: Secondary | ICD-10-CM

## 2013-08-24 DIAGNOSIS — K859 Acute pancreatitis without necrosis or infection, unspecified: Secondary | ICD-10-CM

## 2013-08-24 DIAGNOSIS — F419 Anxiety disorder, unspecified: Secondary | ICD-10-CM

## 2013-08-24 MED ORDER — CLONAZEPAM 0.5 MG PO TABS: 0.5000 mg | ORAL_TABLET | Freq: Two times a day (BID) | ORAL | Status: AC | PRN

## 2013-08-24 MED ORDER — OXYCODONE-ACETAMINOPHEN 5-325 MG PO TABS: 1.0000 | ORAL_TABLET | Freq: Three times a day (TID) | ORAL | Status: AC | PRN

## 2013-08-24 NOTE — Telephone Encounter (Signed)
Pt has appt with you today at 1:15pm. He has called asking for refill on pain medication. You have referred him to pain management and stated in last OV that 1 month and refer to pain management. Candise Bowens had to send referral to [facility]. She is checking to see if they have scheduled him or not. The first referral to pain clinic was denied.  Meyer Cory, LPN

## 2013-08-24 NOTE — Progress Notes (Signed)
CC: [name] is a 42 y.o. male is here for Anxiety   Subjective: HPI:  Complains of worsening anxiety over the past month worsening on a weekly basis described as irritability, nervousness, and difficulty falling asleep. Symptoms are worse with stress of the holidays and with a long-standing annoying houseguest.  Denies return of any bipolar disorder symptoms specifically denies manic-like activity or depression. Has tried hydroxyzine in the past, no help.  Also has tried buspar and multiple SSRIs without much help.  Has been on clonazepam in the past confirmed in controlled substance database.  Requesting refill on oxycodone, has not heard from pain clinic yet on scheduling for chronic right hip pain.  Reports he forgot to have blood work done after last visit for followup of pancreatitis. Denies any epigastric pain or discomfort with eating since I saw him last  Requesting referral for vasectomy     Review Of Systems Outlined In HPI  Past Medical History  Diagnosis Date  . Hypertension   . Depression   . Arthritis   . Kidney stone      Family History  Problem Relation Age of Onset  . Heart attack      grandparents  . Depression Mother   . Hyperlipidemia Mother   . Hypertension Mother   . Stroke       History  Substance Use Topics  . Smoking status: Current Every Day Smoker -- 0.50 packs/day for 20 years    Types: Cigarettes  . Smokeless tobacco: Not on file  . Alcohol Use: 0.5 oz/week    1 drink(s) per week     Objective: Filed Vitals:   08/24/13 1315  BP: 142/102  Pulse: 98    Vital signs reviewed. General: Alert and Oriented, No Acute Distress HEENT: Pupils equal, round, reactive to light. Conjunctivae clear.  External ears unremarkable.  Moist mucous membranes. Lungs: Clear and comfortable work of breathing, speaking in full sentences without accessory muscle use. Cardiac: Regular rate and rhythm.  Neuro: CN II-XII grossly intact, gait  normal. Extremities: No peripheral edema.  Strong peripheral pulses.  Mental Status: No depression,nor agitation. Logical though process. Mild anxiety Skin: Warm and dry.  Assessment & Plan: [name] was seen today for anxiety.  Diagnoses and associated orders for this visit:  Anxiety - clonazePAM (KLONOPIN) 0.5 MG tablet; Take 1 tablet (0.5 mg total) by mouth 2 (two) times daily as needed for anxiety.  Right hip pain - oxyCODONE-acetaminophen (ROXICET) 5-325 MG per tablet; Take 1 tablet by mouth every 8 (eight) hours as needed.  Pancreatitis - Lipase - Lipid panel  Encounter for vasectomy counseling - Ambulatory referral to Urology    Anxiety: restart clonazepam, if needed on daily basis I've recommended that he retry buspar or an SSRI Right hip pain: Refilled percocet as we're still awaiting pain management referral Pancreatitis: Recheck lipase over due for lipid panel to see her triglycerides were contributing to his pancreatitis   Return if symptoms worsen or fail to improve.

## 2013-09-01 ENCOUNTER — Telehealth: Payer: Self-pay | Admitting: *Deleted

## 2013-09-01 NOTE — Telephone Encounter (Signed)
Sue Lush or [place], Can either one of you find out if the second attempt for a referral was successful or not? I only see the first one Candise Bowens tried.

## 2013-09-01 NOTE — Telephone Encounter (Signed)
Victorino Dike, will you send the required information to [facility]?  Thanks.  Meyer Cory, LPN

## 2013-09-01 NOTE — Telephone Encounter (Addendum)
I thought that pt had also been denied at [facility] so I called them. They have not denied this pt so I'm  going to send over his notes and referral for them to review and they will let us know if they will accept him or not

## 2013-09-01 NOTE — Telephone Encounter (Signed)
Sent referral today to [facility]

## 2013-09-04 ENCOUNTER — Telehealth: Payer: Self-pay | Admitting: Family Medicine

## 2013-09-04 DIAGNOSIS — M25551 Pain in right hip: Secondary | ICD-10-CM

## 2013-09-04 NOTE — Telephone Encounter (Signed)
Sue Lush, Will you please let Mr. [name] know that multiple referrals to pain management clinics were not successful.  Since I don't prescribe chronic narcotics for his condition my only recommendation now would be to have an Xray of the right hip and to see Dr. Karie Schwalbe in sports medicine for non-narcotic management of his pain.  I've placed an order for this Xray that he should have done just prior to seeing Dr. Karie Schwalbe.

## 2013-09-04 NOTE — Telephone Encounter (Signed)
Pt notified and was transferred up front to schedule an appt with Dr. Karie Schwalbe

## 2013-09-28 ENCOUNTER — Ambulatory Visit: Payer: Medicare HMO | Admitting: Sports Medicine

## 2013-09-28 DIAGNOSIS — Z0289 Encounter for other administrative examinations: Secondary | ICD-10-CM

## 2013-10-05 ENCOUNTER — Telehealth: Payer: Self-pay

## 2013-10-05 DIAGNOSIS — R1011 Right upper quadrant pain: Secondary | ICD-10-CM

## 2013-10-05 DIAGNOSIS — R110 Nausea: Secondary | ICD-10-CM

## 2013-10-05 NOTE — Telephone Encounter (Signed)
I spoke to patient and he states he needs a referral to a GI. He has stomach pain, nausea and vomiting off and on since June. Denies diarrhea, constipation,  fever, chills or sweats. He states Dr Ivan AnchorsHommel was going to send the referral at his last visit. Patient was unable to follow through due to lack of insurance. He is now covered under an Financial controllerinsurance plan.

## 2013-10-05 NOTE — Telephone Encounter (Signed)
Referral placed.

## 2013-10-06 NOTE — Telephone Encounter (Signed)
Pt.notified

## 2013-10-11 ENCOUNTER — Encounter (HOSPITAL_BASED_OUTPATIENT_CLINIC_OR_DEPARTMENT_OTHER): Payer: Self-pay | Admitting: Emergency Medicine

## 2013-10-11 ENCOUNTER — Emergency Department (HOSPITAL_BASED_OUTPATIENT_CLINIC_OR_DEPARTMENT_OTHER): Payer: Medicare HMO

## 2013-10-11 ENCOUNTER — Emergency Department (HOSPITAL_BASED_OUTPATIENT_CLINIC_OR_DEPARTMENT_OTHER)
Admission: EM | Admit: 2013-10-11 | Discharge: 2013-10-11 | Disposition: A | Discharge status: Home / Self Care | Payer: Medicare HMO | Attending: Emergency Medicine | Admitting: Emergency Medicine

## 2013-10-11 DIAGNOSIS — R1012 Left upper quadrant pain: Secondary | ICD-10-CM | POA: Insufficient documentation

## 2013-10-11 DIAGNOSIS — Z87442 Personal history of urinary calculi: Secondary | ICD-10-CM | POA: Insufficient documentation

## 2013-10-11 DIAGNOSIS — R1031 Right lower quadrant pain: Secondary | ICD-10-CM | POA: Insufficient documentation

## 2013-10-11 DIAGNOSIS — Z79899 Other long term (current) drug therapy: Secondary | ICD-10-CM | POA: Insufficient documentation

## 2013-10-11 DIAGNOSIS — F17200 Nicotine dependence, unspecified, uncomplicated: Secondary | ICD-10-CM | POA: Insufficient documentation

## 2013-10-11 DIAGNOSIS — R1110 Vomiting, unspecified: Secondary | ICD-10-CM | POA: Insufficient documentation

## 2013-10-11 DIAGNOSIS — Z880 Allergy status to penicillin: Secondary | ICD-10-CM | POA: Insufficient documentation

## 2013-10-11 DIAGNOSIS — M129 Arthropathy, unspecified: Secondary | ICD-10-CM | POA: Insufficient documentation

## 2013-10-11 DIAGNOSIS — I10 Essential (primary) hypertension: Secondary | ICD-10-CM | POA: Insufficient documentation

## 2013-10-11 DIAGNOSIS — R109 Unspecified abdominal pain: Secondary | ICD-10-CM

## 2013-10-11 DIAGNOSIS — F3130 Bipolar disorder, current episode depressed, mild or moderate severity, unspecified: Secondary | ICD-10-CM | POA: Insufficient documentation

## 2013-10-11 HISTORY — DX: Bipolar disorder, unspecified: F31.9

## 2013-10-11 LAB — CBC WITH DIFFERENTIAL/PLATELET
Basophils Absolute: 0 10*3/uL (ref 0.0–0.1)
Basophils Relative: 0 % (ref 0–1)
Eosinophils Absolute: 1 10*3/uL — ABNORMAL HIGH (ref 0.0–0.7)
Eosinophils Relative: 8 % — ABNORMAL HIGH (ref 0–5)
HCT: 49.3 % (ref 39.0–52.0)
HEMOGLOBIN: 16.6 g/dL (ref 13.0–17.0)
LYMPHS ABS: 3.8 10*3/uL (ref 0.7–4.0)
LYMPHS PCT: 33 % (ref 12–46)
MCH: 32 pg (ref 26.0–34.0)
MCHC: 33.7 g/dL (ref 30.0–36.0)
MCV: 95.2 fL (ref 78.0–100.0)
Monocytes Absolute: 1.2 10*3/uL — ABNORMAL HIGH (ref 0.1–1.0)
Monocytes Relative: 10 % (ref 3–12)
NEUTROS ABS: 5.7 10*3/uL (ref 1.7–7.7)
NEUTROS PCT: 49 % (ref 43–77)
PLATELETS: 272 10*3/uL (ref 150–400)
RBC: 5.18 MIL/uL (ref 4.22–5.81)
RDW: 12.8 % (ref 11.5–15.5)
WBC: 11.7 10*3/uL — ABNORMAL HIGH (ref 4.0–10.5)

## 2013-10-11 LAB — URINALYSIS, ROUTINE W REFLEX MICROSCOPIC
BILIRUBIN URINE: NEGATIVE
Glucose, UA: NEGATIVE mg/dL
HGB URINE DIPSTICK: NEGATIVE
Ketones, ur: NEGATIVE mg/dL
Leukocytes, UA: NEGATIVE
NITRITE: NEGATIVE
PROTEIN: NEGATIVE mg/dL
SPECIFIC GRAVITY, URINE: 1.021 (ref 1.005–1.030)
UROBILINOGEN UA: 0.2 mg/dL (ref 0.0–1.0)
pH: 5.5 (ref 5.0–8.0)

## 2013-10-11 LAB — COMPREHENSIVE METABOLIC PANEL
ALK PHOS: 88 U/L (ref 39–117)
ALT: 76 U/L — AB (ref 0–53)
AST: 44 U/L — ABNORMAL HIGH (ref 0–37)
Albumin: 3.9 g/dL (ref 3.5–5.2)
BUN: 11 mg/dL (ref 6–23)
CO2: 23 meq/L (ref 19–32)
Calcium: 9.4 mg/dL (ref 8.4–10.5)
Chloride: 99 mEq/L (ref 96–112)
Creatinine, Ser: 0.8 mg/dL (ref 0.50–1.35)
GFR calc Af Amer: >90 mL/min (ref >90)
GFR calc non Af Amer: >90 mL/min (ref >90)
Glucose, Bld: 115 mg/dL — ABNORMAL HIGH (ref 70–99)
POTASSIUM: 4.6 meq/L (ref 3.7–5.3)
SODIUM: 138 meq/L (ref 137–147)
Total Bilirubin: <0.2 mg/dL — ABNORMAL LOW (ref 0.3–1.2)
Total Protein: 7.8 g/dL (ref 6.0–8.3)

## 2013-10-11 LAB — LIPASE, BLOOD: Lipase: 39 U/L (ref 11–59)

## 2013-10-11 MED ORDER — FENTANYL CITRATE 0.05 MG/ML IJ SOLN
50.0000 ug | Freq: Once | INTRAMUSCULAR | Status: AC
  Administered 2013-10-11: 50 ug via INTRAVENOUS
  Filled 2013-10-11: qty 2

## 2013-10-11 MED ORDER — FENTANYL CITRATE 0.05 MG/ML IJ SOLN
100.0000 ug | Freq: Once | INTRAMUSCULAR | Status: AC
  Administered 2013-10-11: 100 ug via INTRAVENOUS
  Filled 2013-10-11: qty 2

## 2013-10-11 MED ORDER — GI COCKTAIL ~~LOC~~
30.0000 mL | Freq: Once | ORAL | Status: AC
  Administered 2013-10-11: 30 mL via ORAL
  Filled 2013-10-11: qty 30

## 2013-10-11 MED ORDER — DICYCLOMINE HCL 10 MG/ML IM SOLN
20.0000 mg | Freq: Once | INTRAMUSCULAR | Status: AC
  Administered 2013-10-11: 20 mg via INTRAMUSCULAR
  Filled 2013-10-11: qty 2

## 2013-10-11 MED ORDER — SODIUM CHLORIDE 0.9 % IV BOLUS (SEPSIS)
500.0000 mL | Freq: Once | INTRAVENOUS | Status: AC
  Administered 2013-10-11: 500 mL via INTRAVENOUS

## 2013-10-11 MED ORDER — IOHEXOL 300 MG/ML  SOLN: 100.0000 mL | Freq: Once | INTRAMUSCULAR | Status: DC | PRN

## 2013-10-11 MED ORDER — ONDANSETRON 8 MG PO TBDP: ORAL_TABLET | ORAL | Status: AC

## 2013-10-11 MED ORDER — IOHEXOL 300 MG/ML  SOLN
50.0000 mL | Freq: Once | INTRAMUSCULAR | Status: AC | PRN
  Administered 2013-10-11: 50 mL via ORAL

## 2013-10-11 MED ORDER — ONDANSETRON HCL 4 MG/2ML IJ SOLN
4.0000 mg | Freq: Once | INTRAMUSCULAR | Status: AC
  Administered 2013-10-11: 4 mg via INTRAVENOUS
  Filled 2013-10-11: qty 2

## 2013-10-11 MED ORDER — OXYCODONE-ACETAMINOPHEN 5-325 MG PO TABS
1.0000 | ORAL_TABLET | Freq: Four times a day (QID) | ORAL | Status: AC | PRN
Start: 2013-10-11 — End: ?

## 2013-10-11 MED ORDER — SUCRALFATE 1 GM/10ML PO SUSP: 1.0000 g | Freq: Three times a day (TID) | ORAL | Status: AC

## 2013-10-11 MED ORDER — FENTANYL CITRATE 0.05 MG/ML IJ SOLN
INTRAMUSCULAR | Status: AC
  Filled 2013-10-11: qty 2

## 2013-10-11 NOTE — ED Notes (Signed)
Returned from CT.

## 2013-10-11 NOTE — ED Notes (Signed)
States abd pain increased after moving on CT table. Rates 8/10. Medicated per order.

## 2013-10-11 NOTE — ED Notes (Signed)
Patient transported to CT 

## 2013-10-11 NOTE — ED Notes (Signed)
C/o right upper abd pain that radiates to his left upper abdomen and radiates down his right side for past several days.  States he has been seen for this before [place]. States he is waiting to get a referral to a GI doctor. States pain is worse when he takes a deep breath. C/o n/v as well. Denies diarrhea. Denies any fevers. Describes as a sharp pain. States he has taken tums, maalox, and prilosec, and phenergan without relief.

## 2013-10-11 NOTE — ED Notes (Signed)
MD with pt  

## 2013-10-11 NOTE — ED Provider Notes (Signed)
CSN: [id number]     Arrival date & time 10/11/13  0108 History   First MD Initiated Contact with Patient 10/11/13 0144     Chief Complaint  Patient presents with  . Abdominal Pain   (Consider location/radiation/quality/duration/timing/severity/associated sxs/prior Treatment) Patient is a 43 y.o. male presenting with abdominal pain. The history is provided by the patient.  Abdominal Pain Pain location:  LUQ, RUQ and RLQ Pain quality: sharp   Pain radiates to:  Does not radiate Pain severity:  Severe Onset quality:  Gradual Duration:  26 weeks (at least) Timing:  Intermittent Progression:  Unchanged Chronicity:  Chronic Context: not awakening from sleep, not diet changes and not trauma   Relieved by:  Nothing Worsened by:  Nothing tried Ineffective treatments: pain medication and phenergan. Associated symptoms: vomiting   Associated symptoms: no dysuria, no fever, no flatus, no shortness of breath and no sore throat   Risk factors: no alcohol abuse     Past Medical History  Diagnosis Date  . Hypertension   . Depression   . Arthritis   . Kidney stone   . Bipolar 1 disorder, depressed    Past Surgical History  Procedure Laterality Date  . Appendectomy  2004  . Hip surgery  1988   Family History  Problem Relation Age of Onset  . Heart attack      grandparents  . Depression Mother   . Hyperlipidemia Mother   . Hypertension Mother   . Stroke     History  Substance Use Topics  . Smoking status: Current Every Day Smoker -- 0.50 packs/day for 20 years    Types: Cigarettes  . Smokeless tobacco: Not on file  . Alcohol Use: No    Review of Systems  Constitutional: Negative for fever.  HENT: Negative for sore throat.   Respiratory: Negative for shortness of breath.   Gastrointestinal: Positive for vomiting and abdominal pain. Negative for flatus.  Genitourinary: Negative for dysuria.  All other systems reviewed and are negative.    Allergies  Penicillins;  Ketorolac tromethamine; Morphine and related; and Tramadol  Home Medications   Current Outpatient Rx  Name  Route  Sig  Dispense  Refill  . ARIPiprazole (ABILIFY) 10 MG tablet   Oral   Take 1 tablet (10 mg total) by mouth daily.   30 tablet   3   . clonazePAM (KLONOPIN) 0.5 MG tablet   Oral   Take 1 tablet (0.5 mg total) by mouth 2 (two) times daily as needed for anxiety.   60 tablet   0   . omeprazole (PRILOSEC) 40 MG capsule      One by mouth daily at least one hour before a meal.   30 capsule   2   . oxyCODONE-acetaminophen (ROXICET) 5-325 MG per tablet   Oral   Take 1 tablet by mouth every 8 (eight) hours as needed.   90 tablet   0    BP 155/94  Pulse 94  Temp(Src) 98.3 F (36.8 C) (Oral)  Resp 18  Ht 6\' 1"  (1.854 m)  Wt 270 lb (122.471 kg)  BMI 35.63 kg/m2  SpO2 97% Physical Exam  Constitutional: He is oriented to person, place, and time. He appears well-developed and well-nourished. No distress.  HENT:  Head: Normocephalic and atraumatic.  Mouth/Throat: Oropharynx is clear and moist. No oropharyngeal exudate.  Eyes: Conjunctivae are normal. Pupils are equal, round, and reactive to light.  Neck: Normal range of motion. Neck supple.  Cardiovascular: Normal  rate, regular rhythm and intact distal pulses.   Pulmonary/Chest: Effort normal and breath sounds normal. He has no wheezes. He has no rales.  Abdominal: Soft. Bowel sounds are increased. There is no tenderness. There is no rebound and no guarding.  Musculoskeletal: Normal range of motion.  Neurological: He is alert and oriented to person, place, and time.  Skin: Skin is warm and dry.  Psychiatric: He has a normal mood and affect.    ED Course  Procedures (including critical care time) Labs Review Labs Reviewed  CBC WITH DIFFERENTIAL - Abnormal; Notable for the following:    WBC 11.7 (*)    Monocytes Absolute 1.2 (*)    Eosinophils Relative 8 (*)    Eosinophils Absolute 1.0 (*)    All other  components within normal limits  COMPREHENSIVE METABOLIC PANEL - Abnormal; Notable for the following:    Glucose, Bld 115 (*)    AST 44 (*)    ALT 76 (*)    Total Bilirubin <0.2 (*)    All other components within normal limits  URINALYSIS, ROUTINE W REFLEX MICROSCOPIC  LIPASE, BLOOD   Imaging Review No results found.  EKG Interpretation   None       MDM  No diagnosis found. Patient with negative CT scan.  Will need to see GI in follow up of his abdominal pain.      Jasmine AweApril K Abbygail [place]ho[place]-Rasch, MD 10/11/13 [phone]

## 2013-10-23 ENCOUNTER — Ambulatory Visit: Payer: Medicare Other | Admitting: Family Medicine

## 2013-11-16 ENCOUNTER — Ambulatory Visit: Payer: Medicare HMO | Admitting: Internal Medicine

## 2014-05-03 IMAGING — CT CT ABD-PELV W/O CM
2 of 4 series · 16 of 46 positions shown, 18 images · non-contrast
Comparison: Abdominal ultrasound performed 10/01/2011, and CT of
the abdomen and pelvis performed 08/22/2010

CLINICAL DATA: Right upper quadrant abdominal pain, radiating to
the left upper quadrant and down the right side. Nausea and
vomiting.

EXAM:
CT ABDOMEN AND PELVIS WITHOUT CONTRAST
TECHNIQUE: Multidetector CT imaging of the abdomen and pelvis was performed
following the standard protocol without intravenous contrast.

[Series 2: abd/pelvis 5.0 b31f · axial · 0.87mm/px · z∈[-848,-332]mm · 13 of 113 slices shown, 15 images]
[im 5/113  soft-tissue]
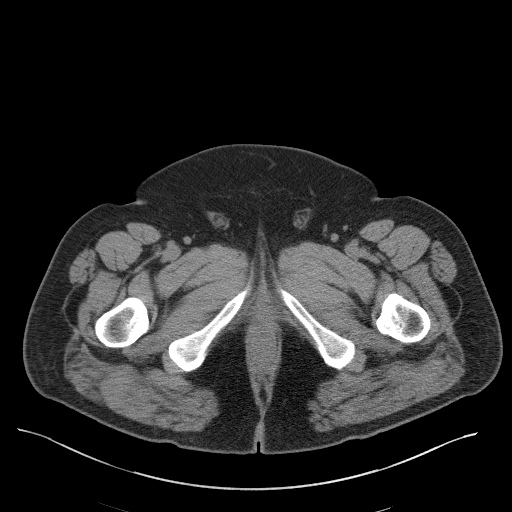
[im 5/113  bone]
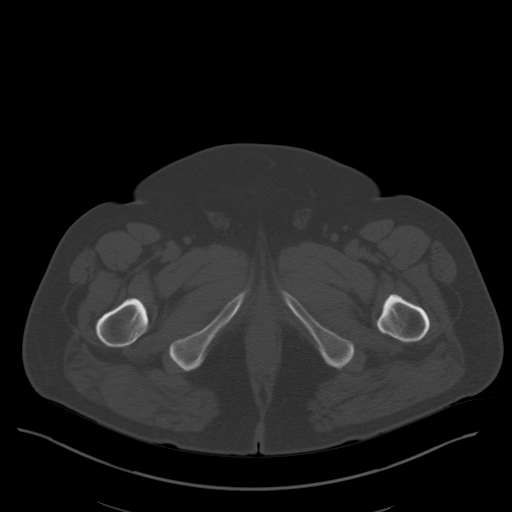
[im 15/113  soft-tissue]
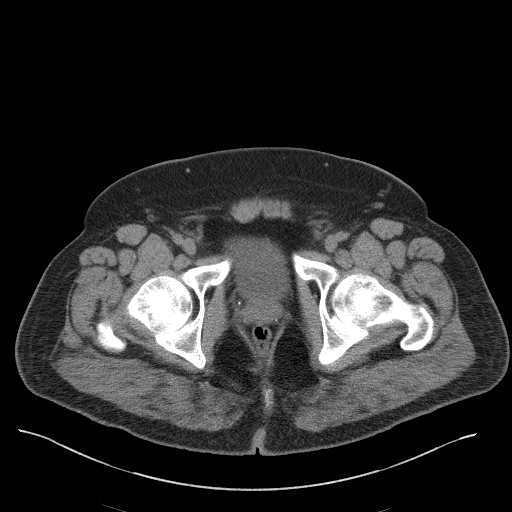
[im 24/113  soft-tissue]
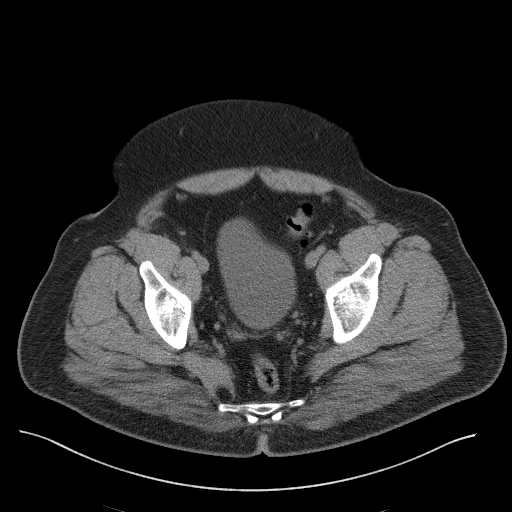
[im 33/113  soft-tissue]
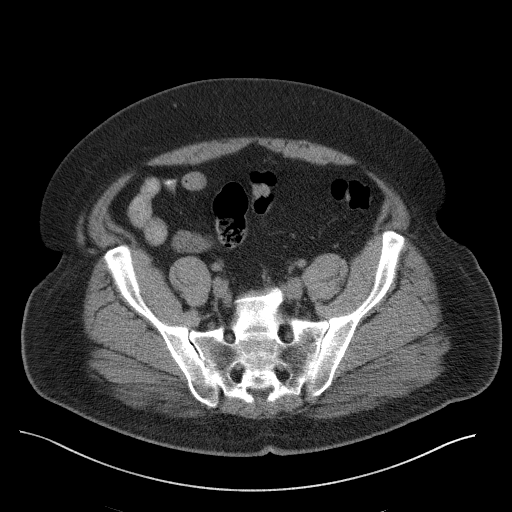
[im 38/113  soft-tissue]
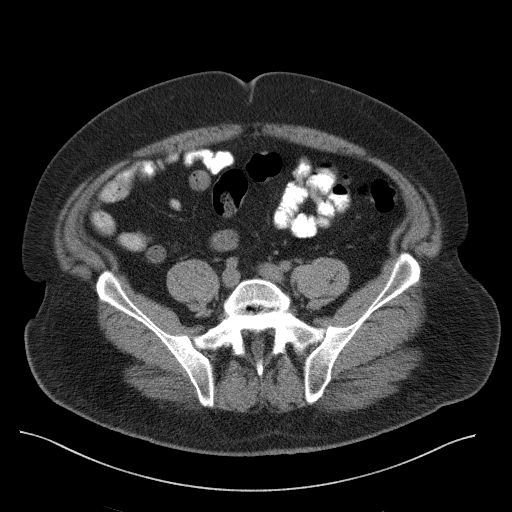
[im 47/113  soft-tissue]
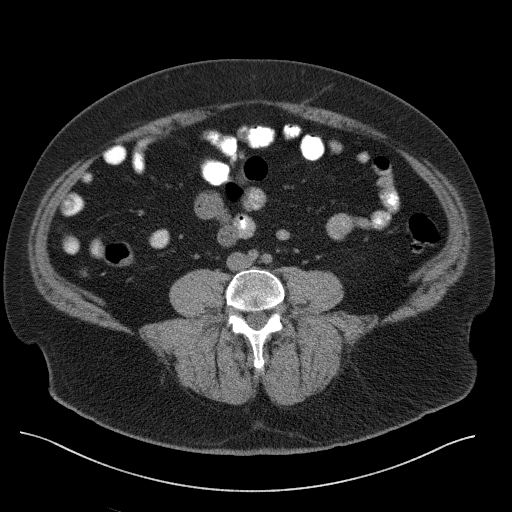
[im 57/113  soft-tissue]
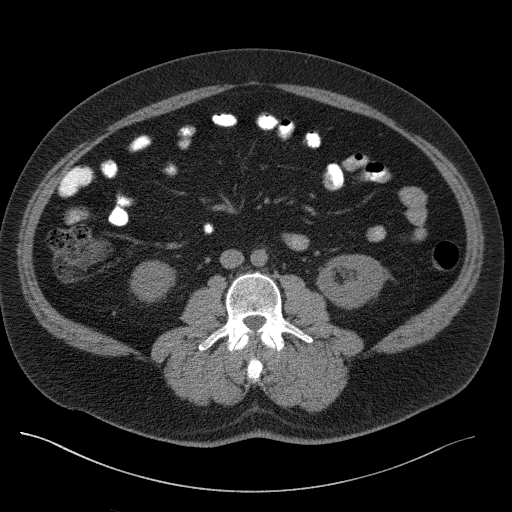
[im 66/113  soft-tissue]
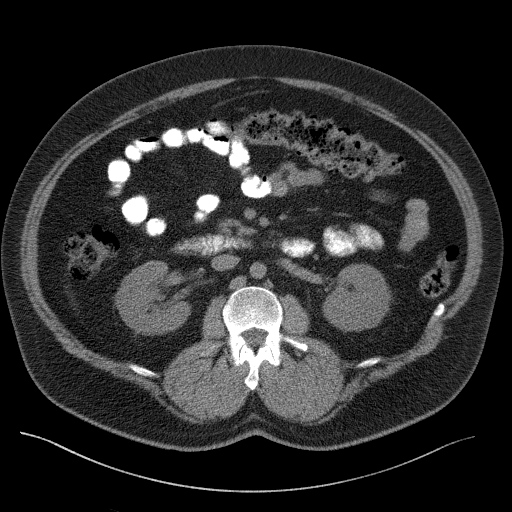
[im 75/113  soft-tissue]
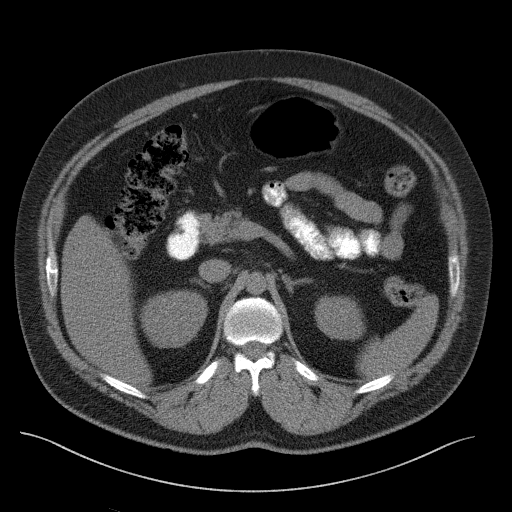
[im 75/113  bone]
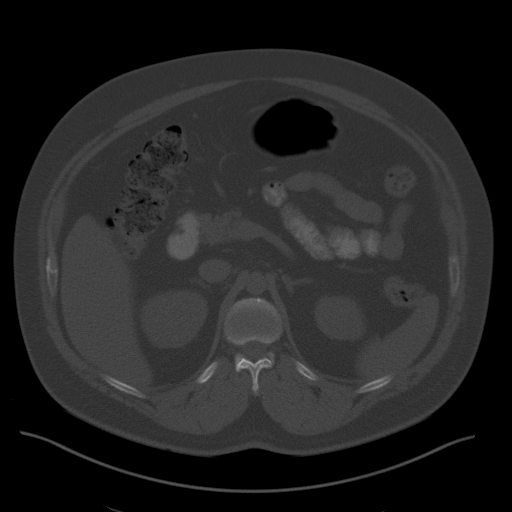
[im 80/113  soft-tissue]
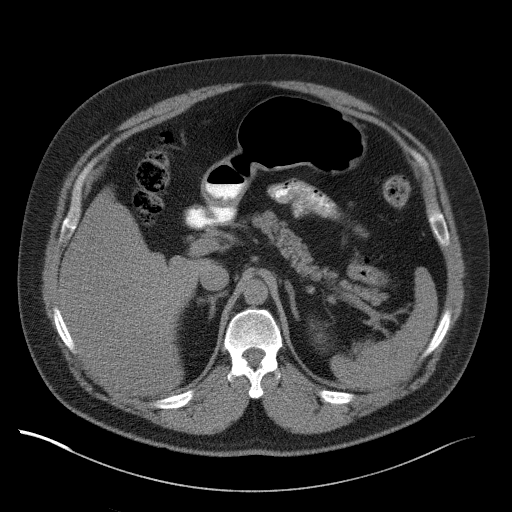
[im 89/113  soft-tissue]
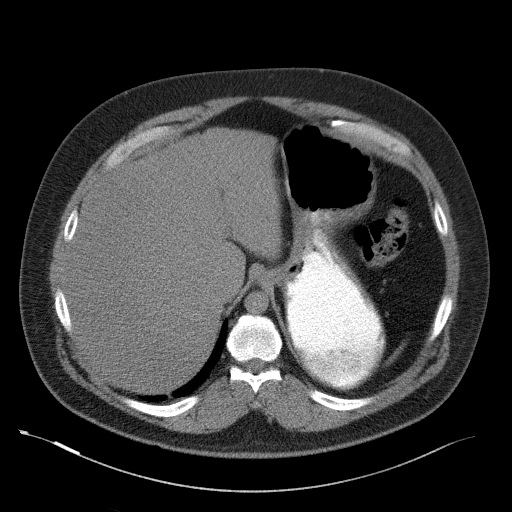
[im 99/113  soft-tissue]
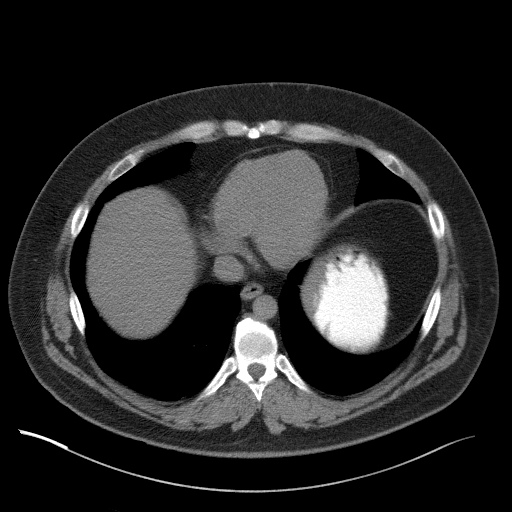
[im 108/113  soft-tissue]
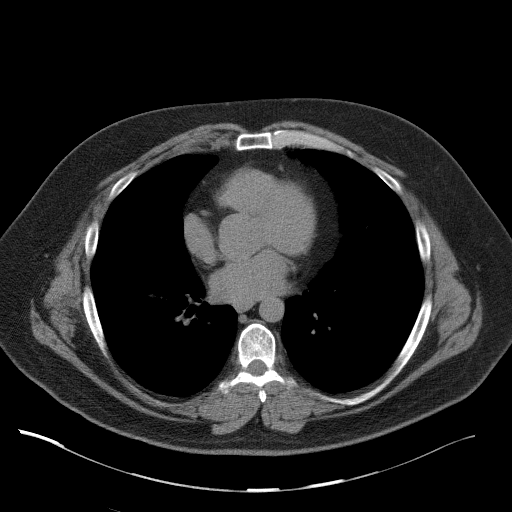

[Series 5: abd/pelvis 3.0 coronal · coronal · 0.95mm/px · 3 of 111 slices shown]
[im 37/111  soft-tissue]
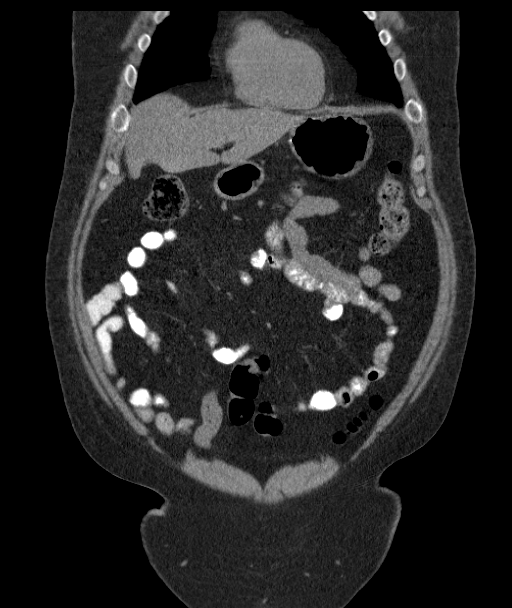
[im 49/111  soft-tissue]
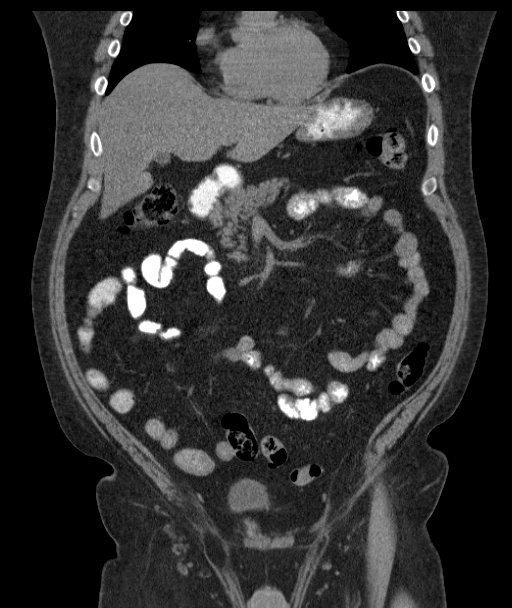
[im 62/111  soft-tissue]
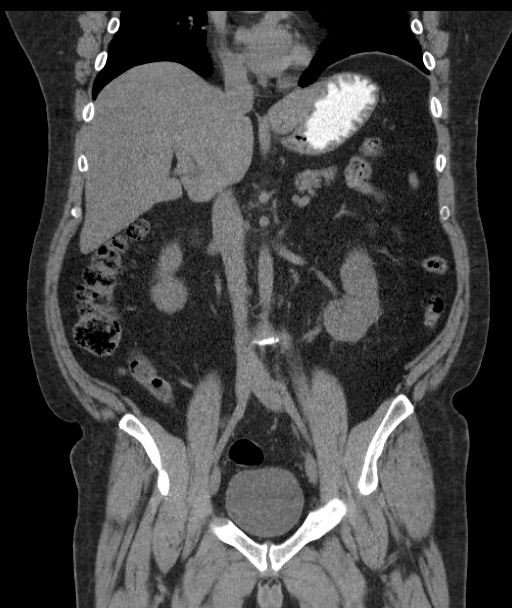

[16 of 46 positions shown; findings below may reference images not displayed]

FINDINGS: The visualized lung bases are clear.

The liver and spleen are unremarkable in appearance. The gallbladder
is within normal limits. The pancreas and adrenal glands are
unremarkable.

Mild nonspecific perinephric stranding is noted bilaterally. The
kidneys are otherwise unremarkable in appearance. There is no
evidence of hydronephrosis. No renal or ureteral stones are seen.

No free fluid is identified. The small bowel is unremarkable in
appearance. The stomach is within normal limits. No acute vascular
abnormalities are seen.

The patient is status post appendectomy. The colon is unremarkable
in appearance.

The bladder is mildly distended and grossly unremarkable in
appearance. The prostate is normal in size. No inguinal
lymphadenopathy is seen.

No acute osseous abnormalities are identified. Postoperative change
is noted at the right acetabulum. Vacuum phenomenon is noted at
L5-S1.
IMPRESSION: No acute abnormality seen in the abdomen or pelvis.
